# Patient Record
Sex: Female | Born: 1985 | Race: White | Hispanic: No | Marital: Married | Smoking: Current some day smoker
Health system: Southern US, Community
[De-identification: ages and names within clinical notes are randomized; demographics above are authoritative.]

## PROBLEM LIST (undated history)

## (undated) DIAGNOSIS — F419 Anxiety disorder, unspecified: Secondary | ICD-10-CM

## (undated) DIAGNOSIS — M549 Dorsalgia, unspecified: Secondary | ICD-10-CM

## (undated) DIAGNOSIS — G8929 Other chronic pain: Secondary | ICD-10-CM

## (undated) DIAGNOSIS — M5136 Other intervertebral disc degeneration, lumbar region: Secondary | ICD-10-CM

## (undated) DIAGNOSIS — M797 Fibromyalgia: Secondary | ICD-10-CM

## (undated) DIAGNOSIS — M51369 Other intervertebral disc degeneration, lumbar region without mention of lumbar back pain or lower extremity pain: Secondary | ICD-10-CM

## (undated) DIAGNOSIS — IMO0002 Reserved for concepts with insufficient information to code with codable children: Secondary | ICD-10-CM

## (undated) DIAGNOSIS — G43909 Migraine, unspecified, not intractable, without status migrainosus: Secondary | ICD-10-CM

## (undated) DIAGNOSIS — R911 Solitary pulmonary nodule: Secondary | ICD-10-CM

## (undated) DIAGNOSIS — M329 Systemic lupus erythematosus, unspecified: Secondary | ICD-10-CM

## (undated) DIAGNOSIS — M1990 Unspecified osteoarthritis, unspecified site: Secondary | ICD-10-CM

## (undated) HISTORY — PX: HYSTEROPLASTY REPAIR OF UTERINE ANOMALY: SUR663

## (undated) HISTORY — PX: CHOLECYSTECTOMY: SHX55

## (undated) HISTORY — PX: TUBAL LIGATION: SHX77

---

## 2005-01-02 ENCOUNTER — Emergency Department (HOSPITAL_COMMUNITY): Admission: EM | Admit: 2005-01-02 | Discharge: 2005-01-02 | Payer: Self-pay | Admitting: Emergency Medicine

## 2016-10-28 ENCOUNTER — Encounter (HOSPITAL_BASED_OUTPATIENT_CLINIC_OR_DEPARTMENT_OTHER): Payer: Self-pay | Admitting: *Deleted

## 2016-10-28 ENCOUNTER — Emergency Department (HOSPITAL_BASED_OUTPATIENT_CLINIC_OR_DEPARTMENT_OTHER)
Admission: EM | Admit: 2016-10-28 | Discharge: 2016-10-28 | Discharge status: Against Medical Advice | Payer: Medicaid Other | Attending: Emergency Medicine | Admitting: Emergency Medicine

## 2016-10-28 DIAGNOSIS — N644 Mastodynia: Secondary | ICD-10-CM | POA: Diagnosis present

## 2016-10-28 DIAGNOSIS — N6452 Nipple discharge: Secondary | ICD-10-CM | POA: Diagnosis not present

## 2016-10-28 DIAGNOSIS — N6320 Unspecified lump in the left breast, unspecified quadrant: Secondary | ICD-10-CM

## 2016-10-28 DIAGNOSIS — Z79899 Other long term (current) drug therapy: Secondary | ICD-10-CM | POA: Diagnosis not present

## 2016-10-28 DIAGNOSIS — F17200 Nicotine dependence, unspecified, uncomplicated: Secondary | ICD-10-CM | POA: Insufficient documentation

## 2016-10-28 HISTORY — DX: Anxiety disorder, unspecified: F41.9

## 2016-10-28 HISTORY — DX: Reserved for concepts with insufficient information to code with codable children: IMO0002

## 2016-10-28 HISTORY — DX: Systemic lupus erythematosus, unspecified: M32.9

## 2016-10-28 LAB — PREGNANCY, URINE: Preg Test, Ur: NEGATIVE

## 2016-10-28 NOTE — ED Provider Notes (Signed)
[place] [facility] Provider Note   CSN: [id number] Arrival date & time: 10/28/16  1207     History   Chief Complaint Chief Complaint  Patient presents with  . Breast Discharge    HPI Katelyn Henry is a 31 y.o. female.  HPI   Pt with hx lupus, family hx breast CA, p/w left breast pain x 3 weeks, bloody and yellow discharge from nipple this morning.  Has had night sweats x 1 month.  Has strong family hx breast CA including mother and several aunts, youngest diagnosed was at 36 years old.  She has not been tested for BRCA, unsure of family's BRCA status.   Denies fevers, CP, SOB.   Last breastfed nearly 2 years ago.   Past Medical History:  Diagnosis Date  . Anxiety   . Lupus     There are no active problems to display for this patient.   Past Surgical History:  Procedure Laterality Date  . CHOLECYSTECTOMY    . HYSTEROPLASTY REPAIR OF UTERINE ANOMALY      OB History    No data available       Home Medications    Prior to Admission medications   Medication Sig Start Date End Date Taking? Authorizing Provider  Acetaminophen (TYLENOL PO) Take by mouth.   Yes Historical Provider, MD  LORazepam (ATIVAN PO) Take by mouth.   Yes Historical Provider, MD    Family History No family history on file.  Social History Social History  Substance Use Topics  . Smoking status: Current Some Day Smoker  . Smokeless tobacco: Never Used  . Alcohol use No     Allergies   Patient has no allergy information on record.   Review of Systems Review of Systems  Constitutional: Positive for fatigue. Negative for fever and unexpected weight change.  HENT: Negative for trouble swallowing.   Respiratory: Negative for shortness of breath.   Cardiovascular: Negative for chest pain.  Gastrointestinal: Negative for abdominal pain and vomiting.  Musculoskeletal: Negative for back pain and neck pain.  Skin: Negative for color change, pallor, rash and wound.    Psychiatric/Behavioral: Negative for self-injury.     Physical Exam Updated Vital Signs BP (!) 144/91   Pulse 82   Temp 98.1 F (36.7 C) (Oral)   Resp 18   Ht '5\' 7"'  (1.702 m)   Wt 111.1 kg   SpO2 100%   BMI 38.37 kg/m   Physical Exam  Constitutional: She appears well-developed and well-nourished. No distress.  HENT:  Head: Normocephalic and atraumatic.  Neck: Neck supple.  Pulmonary/Chest: Effort normal. Left breast exhibits tenderness. Left breast exhibits no inverted nipple and no skin change.  Left breast with tenderness around 2-3:00 area.  No skin changes. No active nipple discharge.  No large masses noted.    Neurological: She is alert.  Skin: She is not diaphoretic.  Nursing note and vitals reviewed.    ED Treatments / Results  Labs (all labs ordered are listed, but only abnormal results are displayed) Labs Reviewed  PREGNANCY, URINE    EKG  EKG Interpretation None       Radiology No results found.  Procedures Procedures (including critical care time)  Medications Ordered in ED Medications - No data to display   Initial Impression / Assessment and Plan / ED Course  I have reviewed the triage vital signs and the nursing notes.  Pertinent labs & imaging results that were available during my care of the patient were  reviewed by me and considered in my medical decision making (see chart for details).     Afebrile nontoxic patient with left breast pain and abnormal discharge.  Strong family hx breast CA.  I called breast center and spoke with Roselyn Reef - was arranging for breast ultrasound and mammogram when I went to speak again with the patient to help set up appointment and found the room being cleaned.  Pt was contact by phone and apparently she needed to leave quickly for child's injury at track practice.  I asked pt to call Roselyn Reef directly at the breast center to discuss payment options so that the appointment could be set up.  Per Roselyn Reef, pt could get  in as early as today or tomorrow.  Orders authorized by me, results to go to primary care provider.   Unfortunately pt eloped prior to any of this being established and without discussion of return precautions or any further evaluation or treatment by the ED.    Final Clinical Impressions(s) / ED Diagnoses   Final diagnoses:  Breast pain in female  Nipple discharge    New Prescriptions New Prescriptions   No medications on file     Clayton Bibles, PA-C 10/28/16 [place], MD 10/28/16 1540

## 2016-10-28 NOTE — ED Notes (Signed)
EDP in to speak with patient and patient not present in room - called patient per demographic cell number in chart and patient says she left because her daughter fell at track and she could return - notified [place], [place] - and Irving Burton spoke to patient via phone.

## 2016-10-28 NOTE — ED Triage Notes (Signed)
3 weeks she has had pain in her left breast. Yellow discharge from her nipple today.

## 2016-10-29 ENCOUNTER — Other Ambulatory Visit: Payer: Self-pay | Admitting: Urology

## 2016-10-29 DIAGNOSIS — N63 Unspecified lump in unspecified breast: Secondary | ICD-10-CM

## 2016-10-30 ENCOUNTER — Other Ambulatory Visit: Payer: Self-pay | Admitting: Urology

## 2016-10-30 DIAGNOSIS — N6452 Nipple discharge: Secondary | ICD-10-CM

## 2016-10-30 DIAGNOSIS — N644 Mastodynia: Secondary | ICD-10-CM

## 2016-10-30 DIAGNOSIS — N63 Unspecified lump in unspecified breast: Secondary | ICD-10-CM

## 2016-10-31 ENCOUNTER — Other Ambulatory Visit: Payer: Self-pay

## 2017-01-25 ENCOUNTER — Emergency Department (HOSPITAL_BASED_OUTPATIENT_CLINIC_OR_DEPARTMENT_OTHER)
Admission: EM | Admit: 2017-01-25 | Discharge: 2017-01-25 | Disposition: A | Discharge status: Home / Self Care | Payer: Medicaid Other | Attending: Emergency Medicine | Admitting: Emergency Medicine

## 2017-01-25 ENCOUNTER — Encounter (HOSPITAL_BASED_OUTPATIENT_CLINIC_OR_DEPARTMENT_OTHER): Payer: Self-pay | Admitting: Emergency Medicine

## 2017-01-25 DIAGNOSIS — Z5321 Procedure and treatment not carried out due to patient leaving prior to being seen by health care provider: Secondary | ICD-10-CM | POA: Insufficient documentation

## 2017-01-25 DIAGNOSIS — R1030 Lower abdominal pain, unspecified: Secondary | ICD-10-CM | POA: Diagnosis present

## 2017-01-25 HISTORY — DX: Unspecified osteoarthritis, unspecified site: M19.90

## 2017-01-25 NOTE — ED Triage Notes (Signed)
Patient called to treatment room, no answer x1.  

## 2017-01-25 NOTE — ED Triage Notes (Signed)
Pt presents with c/o lower abdominal pain pt states she has a history of djd

## 2017-01-26 ENCOUNTER — Emergency Department (HOSPITAL_BASED_OUTPATIENT_CLINIC_OR_DEPARTMENT_OTHER)
Admission: EM | Admit: 2017-01-26 | Discharge: 2017-01-26 | Disposition: A | Discharge status: Home / Self Care | Payer: Medicaid Other | Attending: Emergency Medicine | Admitting: Emergency Medicine

## 2017-01-26 ENCOUNTER — Encounter (HOSPITAL_BASED_OUTPATIENT_CLINIC_OR_DEPARTMENT_OTHER): Payer: Self-pay | Admitting: *Deleted

## 2017-01-26 DIAGNOSIS — M545 Low back pain, unspecified: Secondary | ICD-10-CM

## 2017-01-26 DIAGNOSIS — G8929 Other chronic pain: Secondary | ICD-10-CM

## 2017-01-26 DIAGNOSIS — Z79899 Other long term (current) drug therapy: Secondary | ICD-10-CM | POA: Insufficient documentation

## 2017-01-26 DIAGNOSIS — F17210 Nicotine dependence, cigarettes, uncomplicated: Secondary | ICD-10-CM | POA: Diagnosis not present

## 2017-01-26 HISTORY — DX: Other chronic pain: G89.29

## 2017-01-26 HISTORY — DX: Solitary pulmonary nodule: R91.1

## 2017-01-26 HISTORY — DX: Dorsalgia, unspecified: M54.9

## 2017-01-26 HISTORY — DX: Fibromyalgia: M79.7

## 2017-01-26 HISTORY — DX: Other intervertebral disc degeneration, lumbar region: M51.36

## 2017-01-26 HISTORY — DX: Other intervertebral disc degeneration, lumbar region without mention of lumbar back pain or lower extremity pain: M51.369

## 2017-01-26 LAB — URINALYSIS, ROUTINE W REFLEX MICROSCOPIC
Bilirubin Urine: NEGATIVE
Glucose, UA: NEGATIVE mg/dL
Hgb urine dipstick: NEGATIVE
KETONES UR: NEGATIVE mg/dL
NITRITE: NEGATIVE
Protein, ur: NEGATIVE mg/dL
SPECIFIC GRAVITY, URINE: 1.026 (ref 1.005–1.030)
pH: 6 (ref 5.0–8.0)

## 2017-01-26 LAB — URINALYSIS, MICROSCOPIC (REFLEX)

## 2017-01-26 LAB — PREGNANCY, URINE: Preg Test, Ur: NEGATIVE

## 2017-01-26 MED ORDER — MORPHINE SULFATE (PF) 4 MG/ML IV SOLN
4.0000 mg | Freq: Once | INTRAVENOUS | Status: AC
  Administered 2017-01-26: 4 mg via INTRAMUSCULAR
  Filled 2017-01-26: qty 1

## 2017-01-26 MED ORDER — PROMETHAZINE HCL 25 MG PO TABS: 25.0000 mg | ORAL_TABLET | Freq: Four times a day (QID) | ORAL | 0 refills | Status: AC | PRN

## 2017-01-26 MED ORDER — METHYLPREDNISOLONE 4 MG PO TBPK: ORAL_TABLET | ORAL | 0 refills | Status: DC

## 2017-01-26 NOTE — ED Provider Notes (Signed)
[facility]   CSN: [id number] Arrival date & time: 01/26/17  0910     History   Chief Complaint Chief Complaint  Patient presents with  . Back Pain    HPI Katelyn Henry is a 31 y.o. female with history of chronic back pain, degenerative disc disease, fibromyalgia, lupus, who presents with a three-day history of acute on chronic low back pain. Patient reports that she has felt that she has spasms, worse on her right low back. Her pain is worse with movement. She normally takes Robaxin and tramadol and is managed by her PCP. These medications have not been working over the past few days. She states she has flares like this every now and then and receives a pain shot and steroids and is usually much improved. Patient also reports that she has had intermittent vomiting due to the pain. She has been out of her Phenergan that she takes for this. She also has history of chronic reflux which she attributes or vomiting. She denies any urinary symptoms, chest pain, shortness of breath, abdominal pain. She denies any fevers, numbness or tingling, saddle anesthesia, loss of bowel or bladder control, history of IVDU, cancer, recent procedures to back. No recent trauma or injury.  HPI  Past Medical History:  Diagnosis Date  . Anxiety   . Back pain, chronic   . DDD (degenerative disc disease), lumbar   . Degenerative joint disease   . Fibromyalgia   . Lung nodule    left  . Lupus     There are no active problems to display for this patient.   Past Surgical History:  Procedure Laterality Date  . CESAREAN SECTION    . CHOLECYSTECTOMY    . HYSTEROPLASTY REPAIR OF UTERINE ANOMALY      OB History    No data available       Home Medications    Prior to Admission medications   Medication Sig Start Date End Date Taking? Authorizing Provider  diazepam (VALIUM) 5 MG tablet Take 5 mg by mouth every 12 (twelve) hours as needed for anxiety.   Yes [provider]  gabapentin (NEURONTIN) 100 MG capsule Take 100 mg by mouth 3 (three) times daily.   Yes [provider]  methocarbamol (ROBAXIN) 500 MG tablet Take 500 mg by mouth 3 (three) times daily.   Yes [provider]  omeprazole (PRILOSEC) 20 MG capsule Take 20 mg by mouth daily.   Yes [provider]  pregabalin (LYRICA) 100 MG capsule Take 100 mg by mouth 2 (two) times daily.   Yes [provider]  Acetaminophen (TYLENOL PO) Take by mouth.    [provider]  LORazepam (ATIVAN PO) Take by mouth.    [provider]  methylPREDNISolone (MEDROL DOSEPAK) 4 MG TBPK tablet Follow package directions 01/26/17   Peterson Mathey, Waylan Boga, PA-C  promethazine (PHENERGAN) 25 MG tablet Take 1 tablet (25 mg total) by mouth every 6 (six) hours as needed for nausea or vomiting. 01/26/17   Emi Holes, PA-C    Family History No family history on file.  Social History Social History  Substance Use Topics  . Smoking status: Current Some Day Smoker    Types: Cigarettes  . Smokeless tobacco: Never Used  . Alcohol use No     Allergies   Compazine [prochlorperazine edisylate]; Latex; Penicillins; Percodan [oxycodone-aspirin]; Reglan [metoclopramide]; Skelaxin [metaxalone]; Sulfa antibiotics; and Toradol [ketorolac tromethamine]   Review of Systems Review of Systems  Constitutional: Negative for chills and fever.  HENT: Negative for facial swelling and sore throat.   Respiratory: Negative for shortness of breath.   Cardiovascular: Negative for chest pain.  Gastrointestinal: Positive for nausea and vomiting. Negative for abdominal pain.  Genitourinary: Negative for dysuria.  Musculoskeletal: Positive for back pain. Negative for neck pain.  Skin: Negative for rash and wound.  Neurological: Negative for headaches.  Psychiatric/Behavioral: The patient is not nervous/anxious.      Physical Exam Updated Vital Signs BP (!) 131/96 (BP Location:  Right Arm)   Pulse 78   Temp 98.4 F (36.9 C) (Oral)   Resp 16   Ht 5\' 7"  (1.702 m)   Wt 127 kg (280 lb)   LMP 01/11/2017 (Exact Date)   SpO2 97%   BMI 43.85 kg/m   Physical Exam  Constitutional: She appears well-developed and well-nourished. No distress.  HENT:  Head: Normocephalic and atraumatic.  Mouth/Throat: Oropharynx is clear and moist. No oropharyngeal exudate.  Eyes: Pupils are equal, round, and reactive to light. Conjunctivae are normal. Right eye exhibits no discharge. Left eye exhibits no discharge. No scleral icterus.  Neck: Normal range of motion. Neck supple. No thyromegaly present.  Cardiovascular: Normal rate, regular rhythm, normal heart sounds and intact distal pulses.  Exam reveals no gallop and no friction rub.   No murmur heard. Pulmonary/Chest: Effort normal and breath sounds normal. No stridor. No respiratory distress. She has no wheezes. She has no rales.  Abdominal: Soft. Bowel sounds are normal. She exhibits no distension. There is no tenderness. There is no rebound and no guarding.  Musculoskeletal: She exhibits no edema.  Midline lumbar tenderness and right sided paraspinal tenderness  Lymphadenopathy:    She has no cervical adenopathy.  Neurological: She is alert. Coordination normal.  5/5 strength and normal sensation to bilateral lower extremities; positive straight leg raise on the right  Skin: Skin is warm and dry. No rash noted. She is not diaphoretic. No pallor.  Psychiatric: She has a normal mood and affect.  Nursing note and vitals reviewed.    ED Treatments / Results  Labs (all labs ordered are listed, but only abnormal results are displayed) Labs Reviewed  URINALYSIS, ROUTINE W REFLEX MICROSCOPIC - Abnormal; Notable for the following:       Result Value   APPearance CLOUDY (*)    Leukocytes, UA TRACE (*)    All other components within normal limits  URINALYSIS, MICROSCOPIC (REFLEX) - Abnormal; Notable for the following:    Bacteria,  UA FEW (*)    Squamous Epithelial / LPF TOO NUMEROUS TO COUNT (*)    All other components within normal limits  PREGNANCY, URINE    EKG  EKG Interpretation None       Radiology No results found.  Procedures Procedures (including critical care time)  Medications Ordered in ED Medications  morphine 4 MG/ML injection 4 mg (4 mg Intramuscular Given 01/26/17 1019)     Initial Impression / Assessment and Plan / ED Course  I have reviewed the triage vital signs and the nursing notes.  Pertinent labs & imaging results that were available during my care of the patient were reviewed by me and considered in my medical decision making (see chart for details).     Patient with acute on chronic back pain typical of patient's flareups.  No neurological deficits and normal neuro exam.  Patient is ambulatory.  No loss of bowel or bladder control.  No concern for cauda equina.  No fever, night sweats, weight loss, h/o cancer, IVDA, no recent procedure to back. No urinary symptoms suggestive of UTI. UA was contaminated sample and showed only trace leukocytes and few bacteria. Urine preg negative.  Acute pain controlled with IM morphine in the ED. We will discharge home with Medrol Dosepak, as patient has had success in the past with her flareups with a steroid taper. Supportive care and return precaution discussed. Follow up with PCP this week for further evaluation and treatment of patient's chronic pain. Patient understands and agrees with plan. Patient vitals stable throughout ED course and discharged in satisfactory condition.   Final Clinical Impressions(s) / ED Diagnoses   Final diagnoses:  Chronic right-sided low back pain without sciatica    New Prescriptions New Prescriptions   METHYLPREDNISOLONE (MEDROL DOSEPAK) 4 MG TBPK TABLET    Follow package directions   PROMETHAZINE (PHENERGAN) 25 MG TABLET    Take 1 tablet (25 mg total) by mouth every 6 (six) hours as needed for nausea or  vomiting.     Emi HolesLaw, Shaunna Rosetti M, PA-C 01/26/17 1048    Little, Ambrose Finlandachel Morgan, MD 01/26/17 [phone]

## 2017-01-26 NOTE — Discharge Instructions (Signed)
Medications: Medrol Dosepak, Phenergan  Treatment: Take Medrol Dosepak as directed on the packaging. Ask your pharmacist for any further clarification. Take Phenergan every 6 hours as needed for nausea or vomiting. Attempt the back exercises and stretches 1-2 daily. Use a heating pad 3-4 times daily alternating 20 minutes on, 20 minutes off.  Follow-up: Please follow up your primary care provider about today's flareup. Please return to the emergency department if you develop any new or worsening symptoms.

## 2017-01-26 NOTE — ED Triage Notes (Signed)
Patient states she chronic lower back pain and is currently treated by her PCP with Robaxin and Tramadol.  States for the last three days she has had a flare in her pain and is having back spasms.  States she also has had vomiting for the last three days due to the pain.  States she has chronic reflux and feels the vomiting may be from the acid reflux.  States she feels dehydrated.

## 2017-09-09 ENCOUNTER — Other Ambulatory Visit: Payer: Self-pay

## 2017-09-09 ENCOUNTER — Encounter (HOSPITAL_BASED_OUTPATIENT_CLINIC_OR_DEPARTMENT_OTHER): Payer: Self-pay

## 2017-09-09 DIAGNOSIS — Z5321 Procedure and treatment not carried out due to patient leaving prior to being seen by health care provider: Secondary | ICD-10-CM | POA: Insufficient documentation

## 2017-09-09 DIAGNOSIS — G43909 Migraine, unspecified, not intractable, without status migrainosus: Secondary | ICD-10-CM | POA: Diagnosis not present

## 2017-09-09 NOTE — ED Triage Notes (Signed)
C/o migraine x today-NAD-steady gait 

## 2017-09-10 ENCOUNTER — Emergency Department (HOSPITAL_BASED_OUTPATIENT_CLINIC_OR_DEPARTMENT_OTHER)
Admission: EM | Admit: 2017-09-10 | Discharge: 2017-09-10 | Disposition: A | Discharge status: Home / Self Care | Payer: Medicaid Other | Attending: Emergency Medicine | Admitting: Emergency Medicine

## 2017-09-10 HISTORY — DX: Migraine, unspecified, not intractable, without status migrainosus: G43.909

## 2017-09-10 NOTE — ED Notes (Signed)
No answer when called for a room. 

## 2019-09-08 ENCOUNTER — Encounter (HOSPITAL_BASED_OUTPATIENT_CLINIC_OR_DEPARTMENT_OTHER): Payer: Self-pay | Admitting: Emergency Medicine

## 2019-09-08 ENCOUNTER — Emergency Department (HOSPITAL_BASED_OUTPATIENT_CLINIC_OR_DEPARTMENT_OTHER): Payer: Medicaid Other

## 2019-09-08 ENCOUNTER — Other Ambulatory Visit: Payer: Self-pay

## 2019-09-08 ENCOUNTER — Emergency Department (HOSPITAL_BASED_OUTPATIENT_CLINIC_OR_DEPARTMENT_OTHER)
Admission: EM | Admit: 2019-09-08 | Discharge: 2019-09-08 | Discharge status: Against Medical Advice | Payer: Medicaid Other | Attending: Emergency Medicine | Admitting: Emergency Medicine

## 2019-09-08 DIAGNOSIS — F419 Anxiety disorder, unspecified: Secondary | ICD-10-CM | POA: Insufficient documentation

## 2019-09-08 DIAGNOSIS — Z91040 Latex allergy status: Secondary | ICD-10-CM | POA: Insufficient documentation

## 2019-09-08 DIAGNOSIS — Z5329 Procedure and treatment not carried out because of patient's decision for other reasons: Secondary | ICD-10-CM | POA: Insufficient documentation

## 2019-09-08 DIAGNOSIS — F17210 Nicotine dependence, cigarettes, uncomplicated: Secondary | ICD-10-CM | POA: Insufficient documentation

## 2019-09-08 DIAGNOSIS — Z79899 Other long term (current) drug therapy: Secondary | ICD-10-CM | POA: Insufficient documentation

## 2019-09-08 DIAGNOSIS — R002 Palpitations: Secondary | ICD-10-CM

## 2019-09-08 LAB — BASIC METABOLIC PANEL
Anion gap: 8 (ref 5–15)
BUN: 10 mg/dL (ref 6–20)
CO2: 26 mmol/L (ref 22–32)
Calcium: 8.8 mg/dL — ABNORMAL LOW (ref 8.9–10.3)
Chloride: 102 mmol/L (ref 98–111)
Creatinine, Ser: 0.71 mg/dL (ref 0.44–1.00)
GFR calc Af Amer: >60 mL/min (ref >60)
GFR calc non Af Amer: >60 mL/min (ref >60)
Glucose, Bld: 109 mg/dL — ABNORMAL HIGH (ref 70–99)
Potassium: 3.4 mmol/L — ABNORMAL LOW (ref 3.5–5.1)
Sodium: 136 mmol/L (ref 135–145)

## 2019-09-08 LAB — CBC WITH DIFFERENTIAL/PLATELET
Abs Immature Granulocytes: 0.04 10*3/uL (ref 0.00–0.07)
Basophils Absolute: 0 10*3/uL (ref 0.0–0.1)
Basophils Relative: 0 %
Eosinophils Absolute: 0.3 10*3/uL (ref 0.0–0.5)
Eosinophils Relative: 2 %
HCT: 37.7 % (ref 36.0–46.0)
Hemoglobin: 12.1 g/dL (ref 12.0–15.0)
Immature Granulocytes: 0 %
Lymphocytes Relative: 40 %
Lymphs Abs: 4.7 10*3/uL — ABNORMAL HIGH (ref 0.7–4.0)
MCH: 27.1 pg (ref 26.0–34.0)
MCHC: 32.1 g/dL (ref 30.0–36.0)
MCV: 84.3 fL (ref 80.0–100.0)
Monocytes Absolute: 0.8 10*3/uL (ref 0.1–1.0)
Monocytes Relative: 6 %
Neutro Abs: 6 10*3/uL (ref 1.7–7.7)
Neutrophils Relative %: 52 %
Platelets: 261 10*3/uL (ref 150–400)
RBC: 4.47 MIL/uL (ref 3.87–5.11)
RDW: 16 % — ABNORMAL HIGH (ref 11.5–15.5)
WBC: 11.8 10*3/uL — ABNORMAL HIGH (ref 4.0–10.5)
nRBC: 0 % (ref 0.0–0.2)

## 2019-09-08 LAB — PREGNANCY, URINE: Preg Test, Ur: NEGATIVE

## 2019-09-08 LAB — TROPONIN I (HIGH SENSITIVITY): Troponin I (High Sensitivity): 3 ng/L (ref <18)

## 2019-09-08 MED ORDER — AMITRIPTYLINE HCL 10 MG PO TABS
10.0000 mg | ORAL_TABLET | Freq: Every day | ORAL | Status: DC
  Filled 2019-09-08: qty 1

## 2019-09-08 NOTE — ED Notes (Signed)
IV found in the trash, No pt belongings in the room. Pt not in bathroom or lobby. Will notify provider pt left department.

## 2019-09-08 NOTE — Progress Notes (Signed)
Patient ambulated around the department while on pulse ox.  Patient's SPO2 remained between 97% and 100% and HR remained between 98 and 97.  Patient stated she is feeling better.

## 2019-09-08 NOTE — ED Provider Notes (Addendum)
[facility] EMERGENCY DEPARTMENT Provider Note   CSN: [id number] Arrival date & time: 09/08/19  1801     History Chief Complaint  Patient presents with  . Palpitations    Katelyn Henry is a 34 y.o. female past history of anxiety, chronic back pain, DDD, fibromyalgia, lupus who presents for evaluation of palpitations.  She feels like over the last couple days, she has had intermittent palpitations.  She reports at 10 AM, she had the sensation again felt like it was very strong.  She felt like it was choking her.  She did not have any associated chest pain with this.  She states that it has since improved.  She states she has a history of tachycardia related to anxiety.  She does state that recently, she has been very stressed and anxious that she has been dealing with several family and health issues.  She states that this feels similar to when she has had tachycardia related to her anxiety.  She states she is on amitriptyline for anxiety and has been out of her medication for the last week which she feels like is contributing to her symptoms.  She states she has not had any shortness of breath associated with the symptoms.  She did mention some mild shortness of breath to the triage note but with pain, she states it feels almost like a choking sensation is causing her not to be able to breathe rather than having true shortness of breath.  She does smoke cigarettes.  She denies any other drug or cocaine use.  She does drink coffee but denies any energy drinks. She denies any OCP use, recent immobilization, prior history of DVT/PE, recent surgery, leg swelling, or long travel.  She denies any nausea/vomiting, abdominal pain, fevers, cough.  The history is provided by the patient.       Past Medical History:  Diagnosis Date  . Anxiety   . Back pain, chronic   . DDD (degenerative disc disease), lumbar   . Degenerative joint disease   . Fibromyalgia   . Lung nodule    left  . Lupus  ([place])   . Migraine     There are no problems to display for this patient.   Past Surgical History:  Procedure Laterality Date  . CESAREAN SECTION    . CHOLECYSTECTOMY    . HYSTEROPLASTY REPAIR OF UTERINE ANOMALY    . TUBAL LIGATION       OB History   No obstetric history on file.     No family history on file.  Social History   Tobacco Use  . Smoking status: Current Some Day Smoker    Packs/day: 1.00    Types: Cigarettes  . Smokeless tobacco: Never Used  Substance Use Topics  . Alcohol use: Yes  . Drug use: No    Home Medications Prior to Admission medications   Medication Sig Start Date End Date Taking? Authorizing Provider  amitriptyline (ELAVIL) 10 MG tablet Take 10 mg by mouth at bedtime.   Yes [provider]  gabapentin (NEURONTIN) 300 MG capsule Take by mouth. 07/11/19  Yes [provider]  Acetaminophen (TYLENOL PO) Take by mouth.    [provider]  LORazepam (ATIVAN) 1 MG tablet Take by mouth.    [provider]  methocarbamol (ROBAXIN) 500 MG tablet Take 500 mg by mouth 3 (three) times daily.    [provider]  omeprazole (PRILOSEC) 20 MG capsule Take 20 mg by mouth daily.  [provider]  pregabalin (LYRICA) 100 MG capsule Take 100 mg by mouth 2 (two) times daily.    [provider]  promethazine (PHENERGAN) 25 MG tablet Take 1 tablet (25 mg total) by mouth every 6 (six) hours as needed for nausea or vomiting. 01/26/17   Law, Waylan Boga, PA-C    Allergies    Contrast media [iodinated diagnostic agents], Compazine [prochlorperazine edisylate], Latex, Penicillins, Percodan [oxycodone-aspirin], Reglan [metoclopramide], Skelaxin [metaxalone], Sulfa antibiotics, and Toradol [ketorolac tromethamine]  Review of Systems   Review of Systems  Constitutional: Negative for fever.  Respiratory: Negative for cough and shortness of breath.   Cardiovascular: Positive for palpitations. Negative for chest  pain and leg swelling.  Gastrointestinal: Negative for abdominal pain, nausea and vomiting.  Genitourinary: Negative for dysuria and hematuria.  Neurological: Negative for headaches.  Psychiatric/Behavioral: The patient is nervous/anxious.   All other systems reviewed and are negative.   Physical Exam Updated Vital Signs BP (!) 125/95   Pulse 89   Temp 99 F (37.2 C) (Oral)   Resp 20   Ht 5\' 7"  (1.702 m)   Wt 112.5 kg   LMP 09/03/2019   SpO2 97%   BMI 38.84 kg/m   Physical Exam Vitals and nursing note reviewed.  Constitutional:      Appearance: Normal appearance. She is well-developed.  HENT:     Head: Normocephalic and atraumatic.  Eyes:     General: Lids are normal.     Conjunctiva/sclera: Conjunctivae normal.     Pupils: Pupils are equal, round, and reactive to light.  Cardiovascular:     Rate and Rhythm: Normal rate and regular rhythm.     Pulses: Normal pulses.     Heart sounds: Normal heart sounds. No murmur. No friction rub. No gallop.   Pulmonary:     Effort: Pulmonary effort is normal.     Breath sounds: Normal breath sounds.     Comments: Lungs clear to auscultation bilaterally.  Symmetric chest rise.  No wheezing, rales, rhonchi. Abdominal:     Palpations: Abdomen is soft. Abdomen is not rigid.     Tenderness: There is no abdominal tenderness. There is no guarding.  Musculoskeletal:        General: Normal range of motion.     Cervical back: Full passive range of motion without pain.     Comments: BLE are symmetric in appearance without any overlying warmth or erythema.   Skin:    General: Skin is warm and dry.     Capillary Refill: Capillary refill takes less than 2 seconds.  Neurological:     Mental Status: She is alert and oriented to person, place, and time.  Psychiatric:        Speech: Speech normal.     ED Results / Procedures / Treatments   Labs (all labs ordered are listed, but only abnormal results are displayed) Labs Reviewed  BASIC  METABOLIC PANEL - Abnormal; Notable for the following components:      Result Value   Potassium 3.4 (*)    Glucose, Bld 109 (*)    Calcium 8.8 (*)    All other components within normal limits  CBC WITH DIFFERENTIAL/PLATELET - Abnormal; Notable for the following components:   WBC 11.8 (*)    RDW 16.0 (*)    Lymphs Abs 4.7 (*)    All other components within normal limits  PREGNANCY, URINE  TROPONIN I (HIGH SENSITIVITY)  TROPONIN I (HIGH SENSITIVITY)    EKG EKG  Interpretation  Date/Time:  Wednesday September 08 2019 18:13:43 EST Ventricular Rate:  97 PR Interval:    QRS Duration: 92 QT Interval:  348 QTC Calculation: 442 R Axis:   45 Text Interpretation: Sinus rhythm Low voltage, precordial leads Baseline wander in lead(s) III aVL No old tracing to compare Confirmed by Melene Plan [phone]) on 09/08/2019 6:23:50 PM   Radiology DG Chest 2 View  Result Date: 09/08/2019 CLINICAL DATA:  34 year old female with palpitation. EXAM: CHEST - 2 VIEW COMPARISON:  None. FINDINGS: The heart size and mediastinal contours are within normal limits. Both lungs are clear. The visualized skeletal structures are unremarkable. IMPRESSION: No active cardiopulmonary disease. Electronically Signed   By: Elgie Collard M.D.   On: 09/08/2019 18:58    Procedures Procedures (including critical care time)  Medications Ordered in ED Medications  amitriptyline (ELAVIL) tablet 10 mg (has no administration in time range)    ED Course  I have reviewed the triage vital signs and the nursing notes.  Pertinent labs & imaging results that were available during my care of the patient were reviewed by me and considered in my medical decision making (see chart for details).    MDM Rules/Calculators/A&P                      34 year old female with past medical history of anxiety who presents for evaluation of palpitations.  She states she has had this intermittently over the last few days but felt like at 10 AM, was  more consistent.  She states she has had this before with her anxiety.  She reports she is on amitriptyline for anxiety and states she has been out of it for about few weeks.  She states that when she did have the symptoms, she felt like a choking sensation but has not any shortness of breath.  She states it is gotten better but she wanted to check it out.  She does state this feels consistent with her previous episodes of anxiety.  On initial ED arrival, she is afebrile, nontoxic-appearing.  Vital signs are stable.  No evidence of tachycardia or hypoxia.  At this time, her symptoms do not sound concerning for ACS etiology.  Additionally, do not suspect PE as she has no shortness of breath, pleuritic chest pain or no other PE risk factors. Patient is PERC negative and is low risk for PE.  Additionally, she is not tachycardic or hypoxic here in the ED.  I suspect this is likely related to her anxiety, vertically since she states that this feels very similar.  We will plan to check labs, EKG and monitor.  BMP shows potassium 3.4.  Otherwise unremarkable.  CBC shows leukocytosis of 11.8.  Urine pregnancy negative.  Troponin negative.  Chest x-ray negative for any infectious etiology.  I discussed results with patient.  She reports feeling better here in the ED.  She has maintained a heart rate in the 90s her entire time of being here.  We will plan to ambulate patient and monitor O2 sats.  I discussed with patient that we can give a short course of amitriptyline until she is able to be followed by her PCP.  Patient maintain walking with O2 sats at about 97%.  Patient reported feeling better.  RN inform me that patient had taken out her IV and left.  Portions of this note were generated with Scientist, clinical (histocompatibility and immunogenetics). Dictation errors may occur despite best attempts at proofreading.  Final Clinical  Impression(s) / ED Diagnoses Final diagnoses:  Palpitations  Anxiety    Rx / DC Orders ED Discharge  Orders    None       Maxwell Caul, PA-C 09/08/19 2125    Maxwell Caul, PA-C 09/08/19 2126    Melene Plan, DO 09/08/19 2137

## 2019-09-08 NOTE — ED Triage Notes (Signed)
C/O heart racing and palpitations since 10 am. With some SOB. PT denies chest pain. States feels stressed and anxious last 2 days with family stress.

## 2019-10-19 ENCOUNTER — Other Ambulatory Visit: Payer: Self-pay

## 2019-10-19 ENCOUNTER — Emergency Department (HOSPITAL_BASED_OUTPATIENT_CLINIC_OR_DEPARTMENT_OTHER)
Admission: EM | Admit: 2019-10-19 | Discharge: 2019-10-19 | Disposition: A | Discharge status: Home / Self Care | Payer: Medicaid Other | Attending: Emergency Medicine | Admitting: Emergency Medicine

## 2019-10-19 ENCOUNTER — Encounter (HOSPITAL_BASED_OUTPATIENT_CLINIC_OR_DEPARTMENT_OTHER): Payer: Self-pay | Admitting: Emergency Medicine

## 2019-10-19 DIAGNOSIS — Z880 Allergy status to penicillin: Secondary | ICD-10-CM | POA: Diagnosis not present

## 2019-10-19 DIAGNOSIS — F17210 Nicotine dependence, cigarettes, uncomplicated: Secondary | ICD-10-CM | POA: Diagnosis not present

## 2019-10-19 DIAGNOSIS — Z91040 Latex allergy status: Secondary | ICD-10-CM | POA: Diagnosis not present

## 2019-10-19 DIAGNOSIS — Z882 Allergy status to sulfonamides status: Secondary | ICD-10-CM | POA: Diagnosis not present

## 2019-10-19 DIAGNOSIS — R52 Pain, unspecified: Secondary | ICD-10-CM

## 2019-10-19 DIAGNOSIS — Z91041 Radiographic dye allergy status: Secondary | ICD-10-CM | POA: Diagnosis not present

## 2019-10-19 DIAGNOSIS — M3210 Systemic lupus erythematosus, organ or system involvement unspecified: Secondary | ICD-10-CM | POA: Diagnosis not present

## 2019-10-19 DIAGNOSIS — Z885 Allergy status to narcotic agent status: Secondary | ICD-10-CM | POA: Insufficient documentation

## 2019-10-19 DIAGNOSIS — Z888 Allergy status to other drugs, medicaments and biological substances status: Secondary | ICD-10-CM | POA: Diagnosis not present

## 2019-10-19 DIAGNOSIS — M329 Systemic lupus erythematosus, unspecified: Secondary | ICD-10-CM

## 2019-10-19 DIAGNOSIS — Z79899 Other long term (current) drug therapy: Secondary | ICD-10-CM | POA: Diagnosis not present

## 2019-10-19 DIAGNOSIS — M7918 Myalgia, other site: Secondary | ICD-10-CM | POA: Diagnosis present

## 2019-10-19 LAB — CBC WITH DIFFERENTIAL/PLATELET
Abs Immature Granulocytes: 0.03 10*3/uL (ref 0.00–0.07)
Basophils Absolute: 0 10*3/uL (ref 0.0–0.1)
Basophils Relative: 0 %
Eosinophils Absolute: 0.3 10*3/uL (ref 0.0–0.5)
Eosinophils Relative: 3 %
HCT: 35.6 % — ABNORMAL LOW (ref 36.0–46.0)
Hemoglobin: 11.3 g/dL — ABNORMAL LOW (ref 12.0–15.0)
Immature Granulocytes: 0 %
Lymphocytes Relative: 29 %
Lymphs Abs: 2.9 10*3/uL (ref 0.7–4.0)
MCH: 26.7 pg (ref 26.0–34.0)
MCHC: 31.7 g/dL (ref 30.0–36.0)
MCV: 84.2 fL (ref 80.0–100.0)
Monocytes Absolute: 0.6 10*3/uL (ref 0.1–1.0)
Monocytes Relative: 6 %
Neutro Abs: 6.2 10*3/uL (ref 1.7–7.7)
Neutrophils Relative %: 62 %
Platelets: 218 10*3/uL (ref 150–400)
RBC: 4.23 MIL/uL (ref 3.87–5.11)
RDW: 17.3 % — ABNORMAL HIGH (ref 11.5–15.5)
WBC: 10.1 10*3/uL (ref 4.0–10.5)
nRBC: 0 % (ref 0.0–0.2)

## 2019-10-19 LAB — COMPREHENSIVE METABOLIC PANEL
ALT: 17 U/L (ref 0–44)
AST: 14 U/L — ABNORMAL LOW (ref 15–41)
Albumin: 3.5 g/dL (ref 3.5–5.0)
Alkaline Phosphatase: 58 U/L (ref 38–126)
Anion gap: 7 (ref 5–15)
BUN: 9 mg/dL (ref 6–20)
CO2: 22 mmol/L (ref 22–32)
Calcium: 8.3 mg/dL — ABNORMAL LOW (ref 8.9–10.3)
Chloride: 107 mmol/L (ref 98–111)
Creatinine, Ser: 0.5 mg/dL (ref 0.44–1.00)
GFR calc Af Amer: >60 mL/min (ref >60)
GFR calc non Af Amer: >60 mL/min (ref >60)
Glucose, Bld: 124 mg/dL — ABNORMAL HIGH (ref 70–99)
Potassium: 3.5 mmol/L (ref 3.5–5.1)
Sodium: 136 mmol/L (ref 135–145)
Total Bilirubin: 0.1 mg/dL — ABNORMAL LOW (ref 0.3–1.2)
Total Protein: 6.7 g/dL (ref 6.5–8.1)

## 2019-10-19 LAB — SEDIMENTATION RATE: Sed Rate: 47 mm/hr — ABNORMAL HIGH (ref 0–22)

## 2019-10-19 MED ORDER — LACTATED RINGERS IV BOLUS
1000.0000 mL | Freq: Once | INTRAVENOUS | Status: AC
  Administered 2019-10-19: 11:00:00 1000 mL via INTRAVENOUS

## 2019-10-19 MED ORDER — ONDANSETRON HCL 4 MG/2ML IJ SOLN
4.0000 mg | Freq: Once | INTRAMUSCULAR | Status: AC
  Administered 2019-10-19: 4 mg via INTRAVENOUS
  Filled 2019-10-19: qty 2

## 2019-10-19 MED ORDER — DEXAMETHASONE SODIUM PHOSPHATE 10 MG/ML IJ SOLN
10.0000 mg | Freq: Once | INTRAMUSCULAR | Status: AC
  Administered 2019-10-19: 10 mg via INTRAVENOUS
  Filled 2019-10-19: qty 1

## 2019-10-19 MED ORDER — MORPHINE SULFATE (PF) 4 MG/ML IV SOLN
4.0000 mg | Freq: Once | INTRAVENOUS | Status: AC
  Administered 2019-10-19: 4 mg via INTRAVENOUS
  Filled 2019-10-19: qty 1

## 2019-10-19 NOTE — ED Triage Notes (Signed)
Pt c/o pain to entire body since yesterday; sts this is a lupus flare

## 2019-10-19 NOTE — ED Provider Notes (Signed)
[facility] EMERGENCY DEPARTMENT Provider Note   CSN: [id number] Arrival date & time: 10/19/19  7414     History Chief Complaint  Patient presents with  . Body Pain    Katelyn Henry is a 34 y.o. female.  Patient is a 34 year old female with a history of lupus, fibromyalgia, migraines who is presenting today with full body pain.  Patient states symptoms started yesterday and have worsened.  She denies headache today, nausea or vomiting.  She has not had fever, shortness of breath, abdominal pain or urinary complaints.  Patient has not been around anybody who has had Covid and denies any Covid-like symptoms.  She reports that she recently moved back to this area and she has an appointment with neurology tomorrow and then cardiology and then rheumatology with [facility] next week.  She currently is not taking any medication and states she does not know if this pain is from a fibromyalgia flare versus a lupus flare.  She has not had any nausea or vomiting.  She has been seen multiple times in the last 2 weeks in prior emergency rooms and reports she went to [facility] last night with this discomfort but the wait was too long and she decided to go home.  She has been taking 1000 mg Tylenol with minimal relief.  She denies any fever or rashes.  The history is provided by the patient.       Past Medical History:  Diagnosis Date  . Anxiety   . Back pain, chronic   . DDD (degenerative disc disease), lumbar   . Degenerative joint disease   . Fibromyalgia   . Lung nodule    left  . Lupus ([place])   . Migraine     There are no problems to display for this patient.   Past Surgical History:  Procedure Laterality Date  . CESAREAN SECTION    . CHOLECYSTECTOMY    . HYSTEROPLASTY REPAIR OF UTERINE ANOMALY    . TUBAL LIGATION       OB History   No obstetric history on file.     No family history on file.  Social History   Tobacco Use  . Smoking status: Current Some Day  Smoker    Packs/day: 1.00    Types: Cigarettes  . Smokeless tobacco: Never Used  Substance Use Topics  . Alcohol use: Yes  . Drug use: No    Home Medications Prior to Admission medications   Medication Sig Start Date End Date Taking? Authorizing Provider  Acetaminophen (TYLENOL PO) Take by mouth.    [provider]  amitriptyline (ELAVIL) 10 MG tablet Take 10 mg by mouth at bedtime.    [provider]  gabapentin (NEURONTIN) 300 MG capsule Take by mouth. 07/11/19   [provider]  LORazepam (ATIVAN) 1 MG tablet Take by mouth.    [provider]  methocarbamol (ROBAXIN) 500 MG tablet Take 500 mg by mouth 3 (three) times daily.    [provider]  omeprazole (PRILOSEC) 20 MG capsule Take 20 mg by mouth daily.    [provider]  pregabalin (LYRICA) 100 MG capsule Take 100 mg by mouth 2 (two) times daily.    [provider]  promethazine (PHENERGAN) 25 MG tablet Take 1 tablet (25 mg total) by mouth every 6 (six) hours as needed for nausea or vomiting. 01/26/17   Law, Bea Graff, PA-C    Allergies    Contrast media [iodinated diagnostic agents], Compazine [prochlorperazine  edisylate], Latex, Penicillins, Percodan [oxycodone-aspirin], Reglan [metoclopramide], Skelaxin [metaxalone], Sulfa antibiotics, and Toradol [ketorolac tromethamine]  Review of Systems   Review of Systems  All other systems reviewed and are negative.   Physical Exam Updated Vital Signs BP (!) 160/94 (BP Location: Right Arm)   Pulse 90   Temp 98.4 F (36.9 C) (Oral)   Resp 18   Ht '5\' 7"'  (1.702 m)   Wt 112.5 kg   LMP 09/25/2019   SpO2 98%   BMI 38.84 kg/m   Physical Exam Vitals and nursing note reviewed.  Constitutional:      General: She is not in acute distress.    Appearance: Normal appearance. She is well-developed. She is obese.  HENT:     Head: Normocephalic and atraumatic.  Eyes:     Pupils: Pupils are equal, round, and reactive to  light.  Cardiovascular:     Rate and Rhythm: Normal rate and regular rhythm.     Pulses: Normal pulses.     Heart sounds: Normal heart sounds. No murmur. No friction rub.  Pulmonary:     Effort: Pulmonary effort is normal.     Breath sounds: Normal breath sounds. No wheezing or rales.  Abdominal:     General: Bowel sounds are normal. There is no distension.     Palpations: Abdomen is soft.     Tenderness: There is no abdominal tenderness. There is no guarding or rebound.  Musculoskeletal:        General: Tenderness present. Normal range of motion.     Right lower leg: No edema.     Left lower leg: No edema.     Comments: No edema.  Tenderness with palpation of bilateral legs, shoulders, thoracic and lumbar area.  No localized area of erythema, swelling or induration.  No swollen or red joints.  Skin:    General: Skin is warm and dry.     Findings: No rash.  Neurological:     Mental Status: She is alert and oriented to person, place, and time.     Cranial Nerves: No cranial nerve deficit.     Motor: No weakness.  Psychiatric:        Mood and Affect: Mood normal.        Behavior: Behavior normal.        Thought Content: Thought content normal.     ED Results / Procedures / Treatments   Labs (all labs ordered are listed, but only abnormal results are displayed) Labs Reviewed  CBC WITH DIFFERENTIAL/PLATELET - Abnormal; Notable for the following components:      Result Value   Hemoglobin 11.3 (*)    HCT 35.6 (*)    RDW 17.3 (*)    All other components within normal limits  COMPREHENSIVE METABOLIC PANEL - Abnormal; Notable for the following components:   Glucose, Bld 124 (*)    Calcium 8.3 (*)    AST 14 (*)    Total Bilirubin 0.1 (*)    All other components within normal limits  SEDIMENTATION RATE - Abnormal; Notable for the following components:   Sed Rate 47 (*)    All other components within normal limits    EKG None  Radiology No results  found.  Procedures Procedures (including critical care time)  Medications Ordered in ED Medications  lactated ringers bolus 1,000 mL (has no administration in time range)  morphine 4 MG/ML injection 4 mg (has no administration in time range)  ondansetron (ZOFRAN) injection 4 mg (has no  administration in time range)  dexamethasone (DECADRON) injection 10 mg (has no administration in time range)    ED Course  I have reviewed the triage vital signs and the nursing notes.  Pertinent labs & imaging results that were available during my care of the patient were reviewed by me and considered in my medical decision making (see chart for details).    MDM Rules/Calculators/A&P                      34 year old female presenting with nonspecific pain that she describes as mostly in her extremities and back.  She does have a history of lupus and fibromyalgia and feels that she is going through a flare.  She states she does have body pain like this often since she has been off all medications.  She moved back into town several months ago and is getting follow-up but has not seen the specialist yet.  She denies any infectious symptoms with low suspicion for infectious etiology as the cause.  Patient does have history of chronic kidney disease because of lupus but last creatinine was 1.5 approximately 2-1/2 weeks ago.  She denies any urinary symptoms.  Patient has been seen numerous times in the last few weeks in surrounding emergency rooms but it was more related to migraine headaches which she also has a history of.  She is denying any headache today.  Patient does not look acutely ill at this time.  Will check labs to ensure sed rate is not elevated and renal function appears stable given her symptoms.  She was given an IV dose of Decadron and fluids.  12:15 PM ESR is elevated at 47 but renal function is now normal at 0.5 and CBC without acute findings.  Patient improved after above medication.  Will  discharge home and encouraged to follow with her neurologist as planned tomorrow.  Final Clinical Impression(s) / ED Diagnoses Final diagnoses:  Whole body pain  Lupus [facility])    Rx / DC Orders ED Discharge Orders    None       Blanchie Dessert, MD 10/19/19 1217

## 2019-10-21 ENCOUNTER — Encounter (HOSPITAL_BASED_OUTPATIENT_CLINIC_OR_DEPARTMENT_OTHER): Payer: Medicaid Other

## 2022-02-21 IMAGING — CR DG CHEST 2V
2 series · 2 of 2 positions shown · non-contrast
Comparison: None.

CLINICAL DATA: 33-year-old female with palpitation.

EXAM:
CHEST - 2 VIEW

[w chest pa]
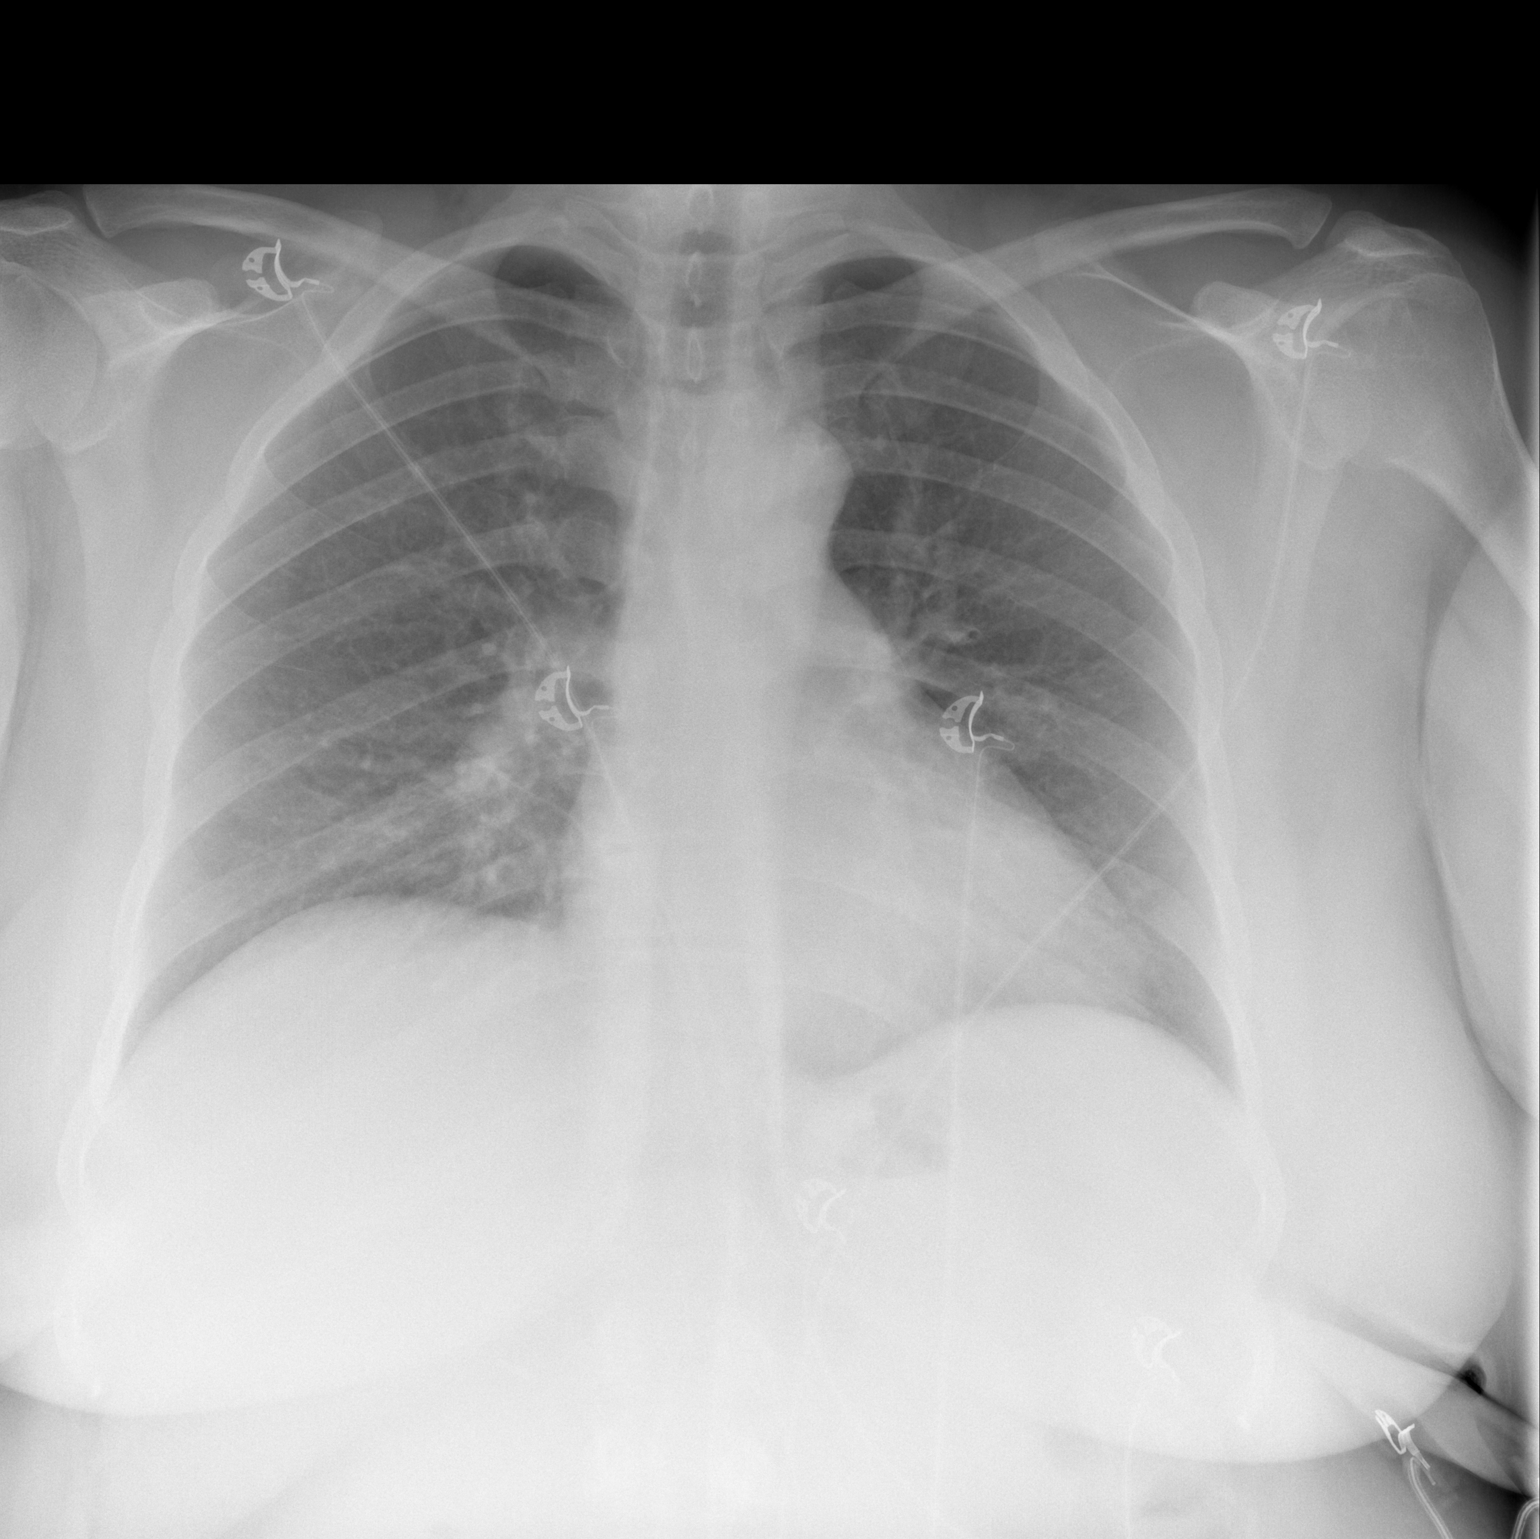

[w chest lat]
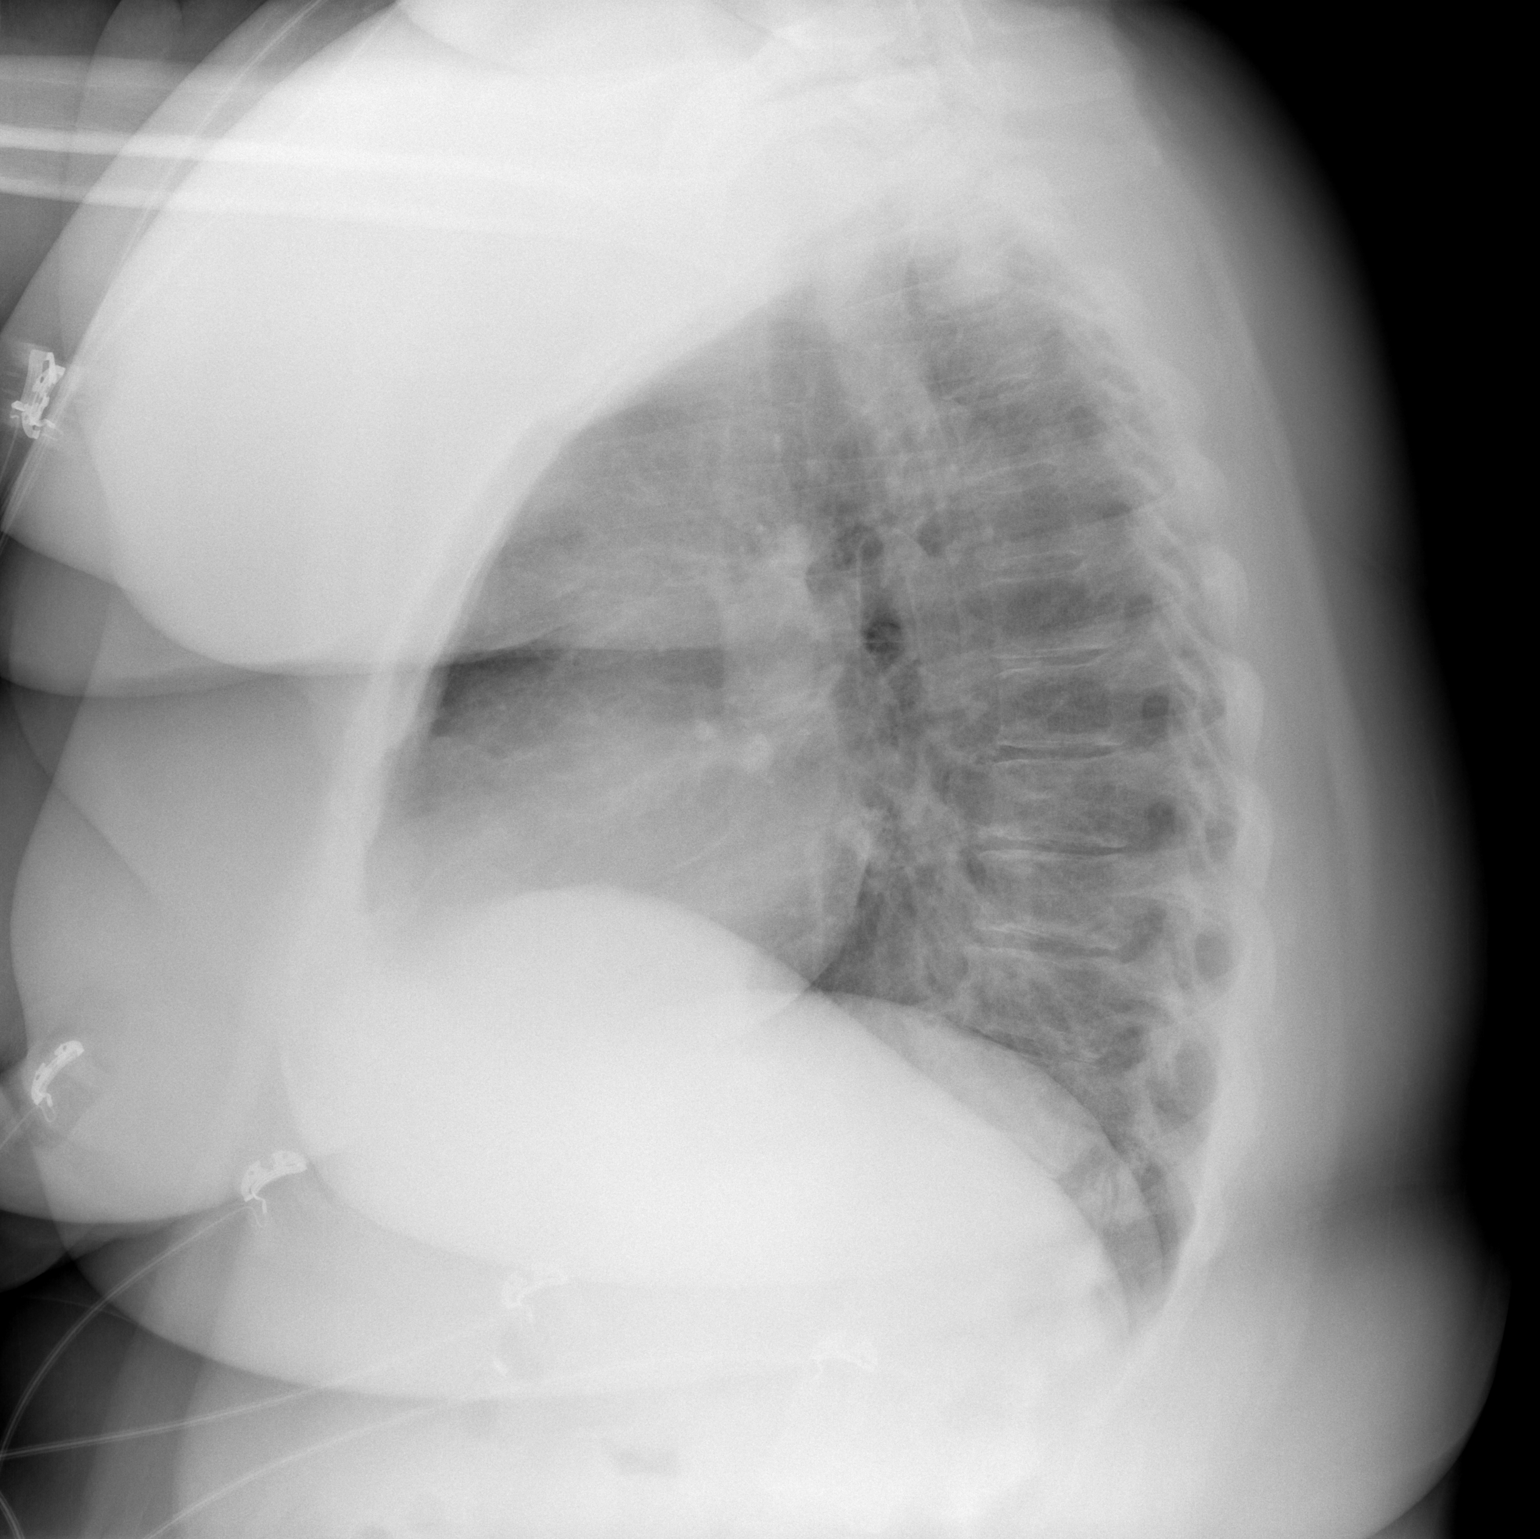

[2 of 2 positions shown; findings below may reference images not displayed]

FINDINGS: The heart size and mediastinal contours are within normal limits.
Both lungs are clear. The visualized skeletal structures are
unremarkable.
IMPRESSION: No active cardiopulmonary disease.
